# Patient Record
Sex: Male | Born: 1995 | Race: White | Hispanic: No | Marital: Single | Smoking: Never smoker
Health system: Southern US, Community
[De-identification: ages and names within clinical notes are randomized; demographics above are authoritative.]

---
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
## 2018-01-31 IMAGING — DX DG KNEE AP/LAT W/ SUNRISE*R*
3 series · 3 of 3 positions shown · non-contrast
Comparison: None.

CLINICAL DATA: Lateral right knee palpable mass for the past 3
months. Pain at that location.

EXAM:
RIGHT KNEE 3 VIEWS

[knee ap]
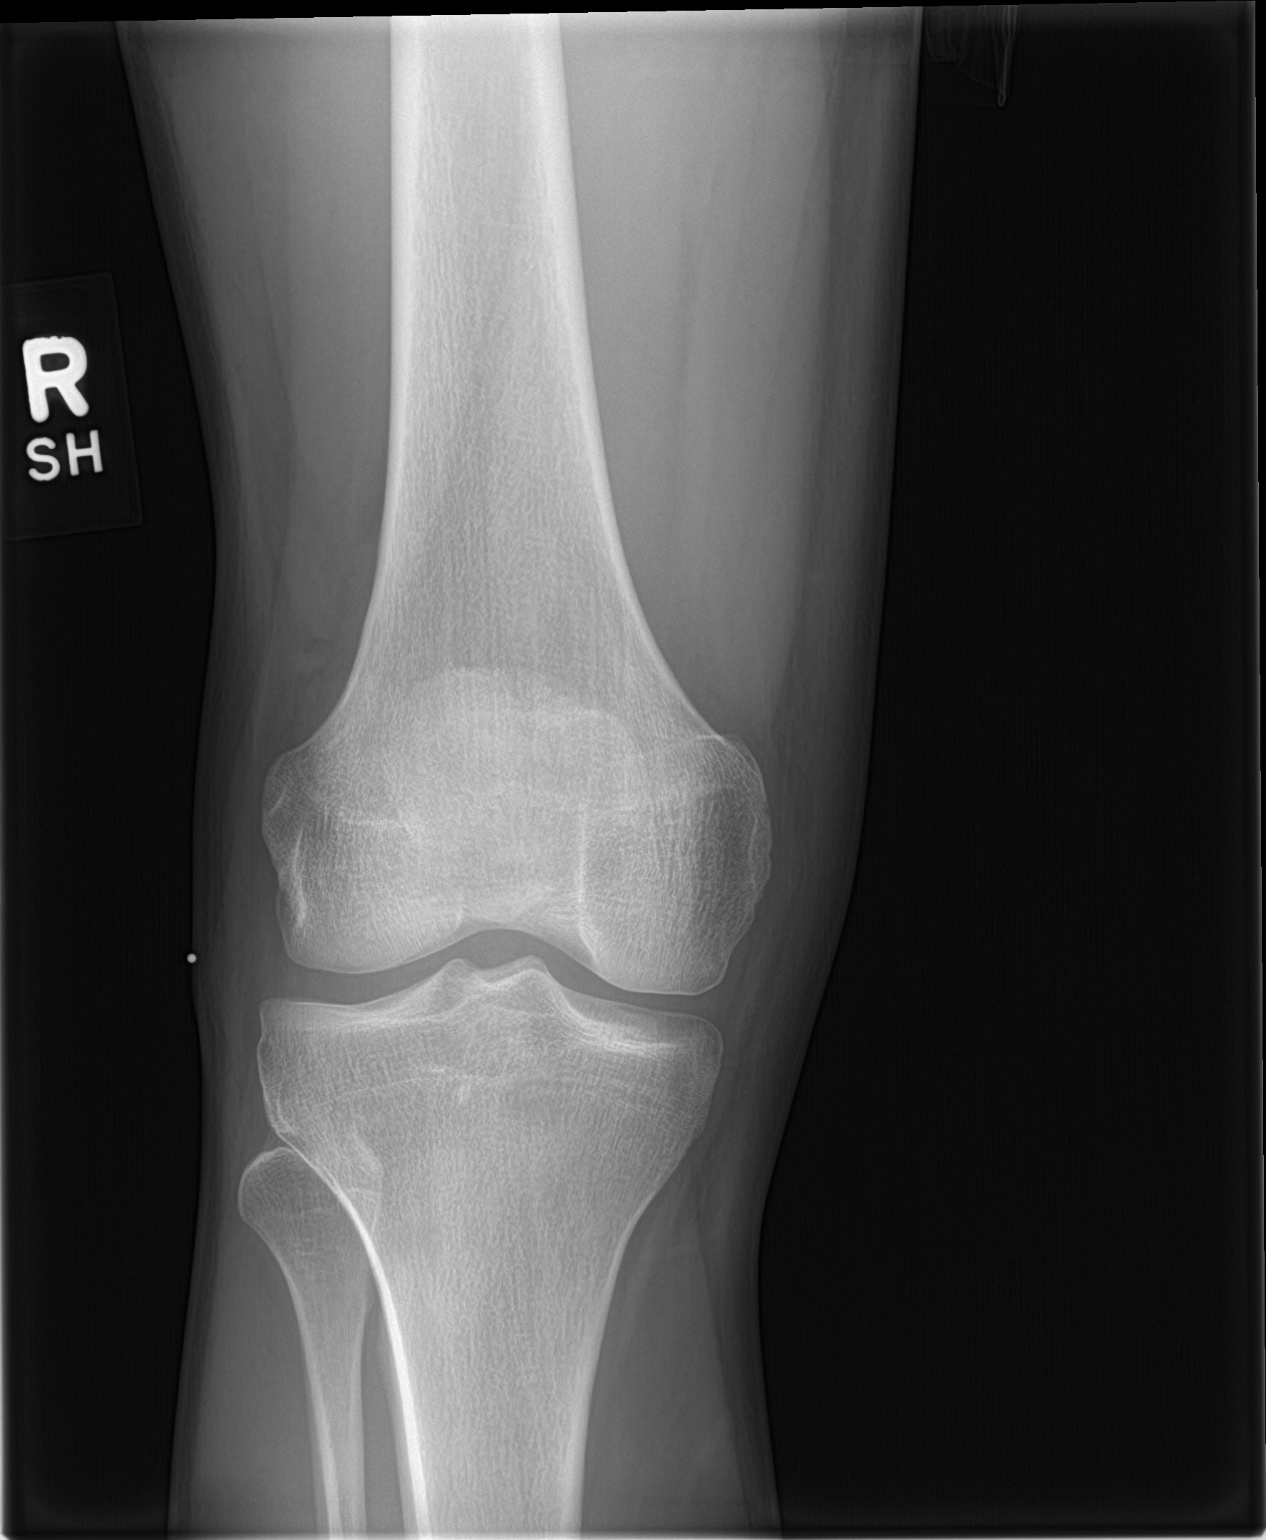

[knee lat]
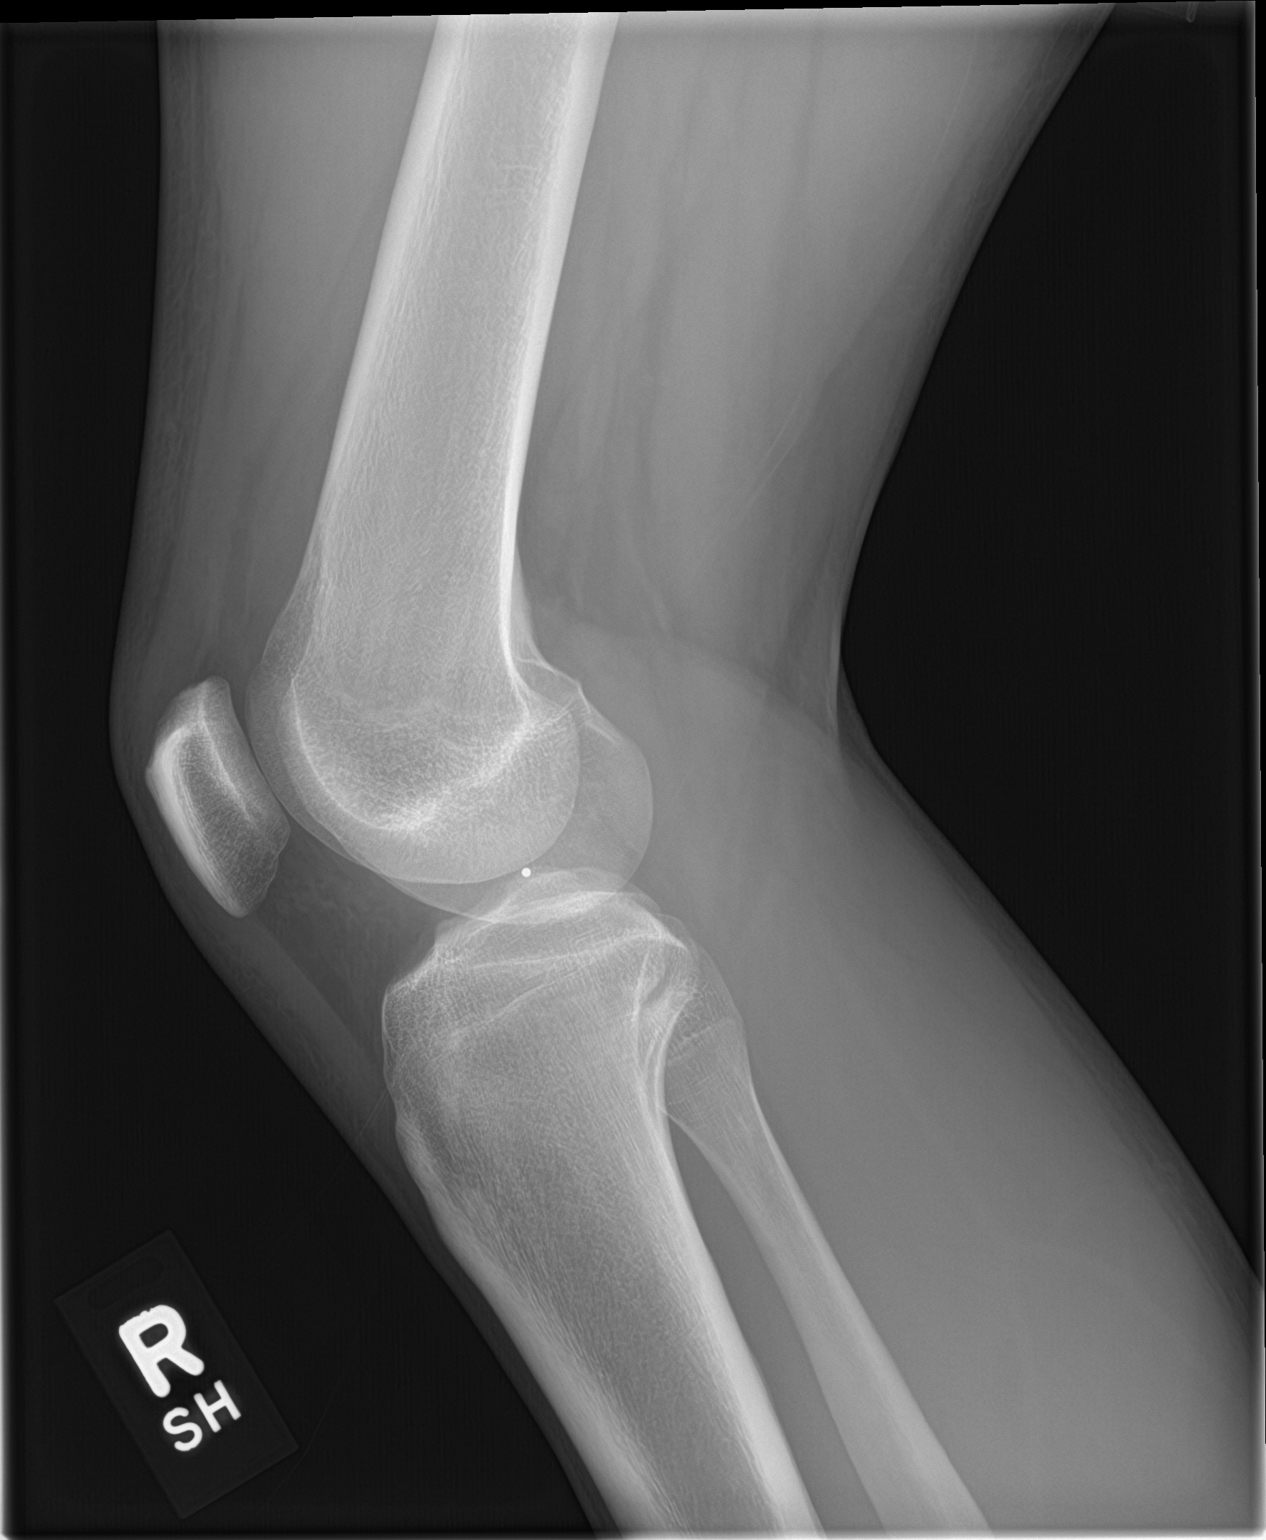

[knee sunrise]
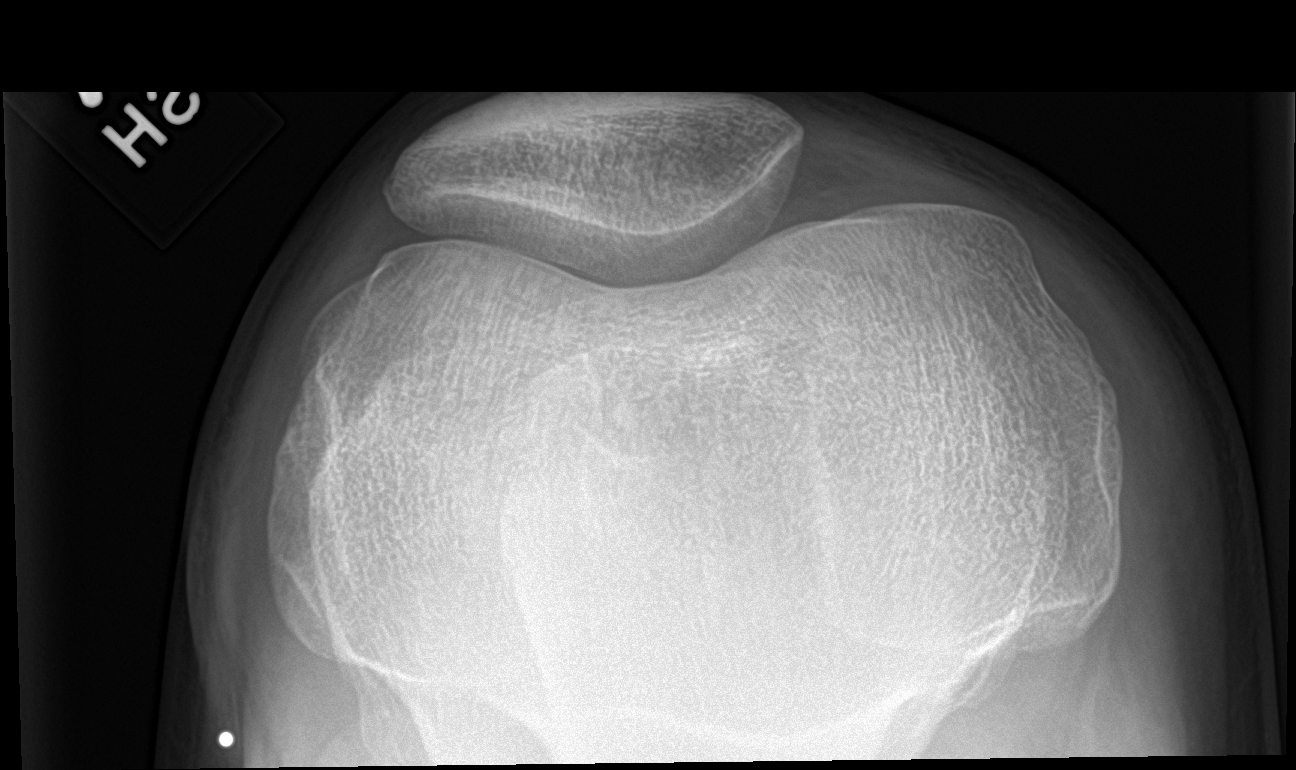

[3 of 3 positions shown; findings below may reference images not displayed]

FINDINGS: Three views of the right knee are submitted for interpretation. A
marker is demonstrated at the location of the palpable mass and
focal pain. There is mild outward bulging of the soft tissues at
that location. No calcification is seen. The bones appear normal. No
effusion.
IMPRESSION: Mild lower soft tissue bulging at the location of the palpable mass
and pain in the lateral right knee. This could be due to a soft
tissue mass or ganglion cyst. These possibilities could be
differentiated with a magnetic resonance imaging examination of the
knee.

## 2023-04-28 ENCOUNTER — Other Ambulatory Visit: Payer: Self-pay

## 2023-04-28 ENCOUNTER — Emergency Department (HOSPITAL_BASED_OUTPATIENT_CLINIC_OR_DEPARTMENT_OTHER)
Admission: EM | Admit: 2023-04-28 | Discharge: 2023-04-29 | Disposition: A | Discharge status: Home / Self Care | Payer: BC Managed Care – PPO | Attending: Emergency Medicine | Admitting: Emergency Medicine

## 2023-04-28 ENCOUNTER — Encounter (HOSPITAL_BASED_OUTPATIENT_CLINIC_OR_DEPARTMENT_OTHER): Payer: Self-pay | Admitting: Emergency Medicine

## 2023-04-28 DIAGNOSIS — Z23 Encounter for immunization: Secondary | ICD-10-CM | POA: Diagnosis not present

## 2023-04-28 DIAGNOSIS — S01312A Laceration without foreign body of left ear, initial encounter: Secondary | ICD-10-CM | POA: Insufficient documentation

## 2023-04-28 DIAGNOSIS — W540XXA Bitten by dog, initial encounter: Secondary | ICD-10-CM | POA: Insufficient documentation

## 2023-04-28 NOTE — ED Triage Notes (Signed)
Dog bite to left ear, personal dog, shots up to date.

## 2023-04-29 MED ORDER — AMOXICILLIN-POT CLAVULANATE 875-125 MG PO TABS
1.0000 | ORAL_TABLET | Freq: Once | ORAL | Status: AC
  Administered 2023-04-29: 1 via ORAL
  Filled 2023-04-29: qty 1

## 2023-04-29 MED ORDER — LIDOCAINE-EPINEPHRINE-TETRACAINE (LET) TOPICAL GEL
3.0000 mL | Freq: Once | TOPICAL | Status: AC
  Administered 2023-04-29: 3 mL via TOPICAL
  Filled 2023-04-29: qty 3

## 2023-04-29 MED ORDER — TETANUS-DIPHTH-ACELL PERTUSSIS 5-2.5-18.5 LF-MCG/0.5 IM SUSY
0.5000 mL | PREFILLED_SYRINGE | Freq: Once | INTRAMUSCULAR | Status: AC
  Administered 2023-04-29: 0.5 mL via INTRAMUSCULAR
  Filled 2023-04-29: qty 0.5

## 2023-04-29 MED ORDER — AMOXICILLIN-POT CLAVULANATE 875-125 MG PO TABS: 1.0000 | ORAL_TABLET | Freq: Two times a day (BID) | ORAL | 0 refills | Status: AC

## 2023-04-29 NOTE — Discharge Instructions (Addendum)
 It was a pleasure caring for you today in the emergency department.  You can wash the wound with gentle soap and water twice daily over the next 7 days.  Please return either here, urgent care, primary care doctor's office to have sutures evaluated for removal in approximately 5 to 7 days.  If he noticed any significant redness, significant pain, pus draining from the wound, severe swelling, any worsening / worrisome symptoms please return to the ER for evaluation.  Do not submerge your head underwater, no hot tubs or swimming pools until the wound has healed completely.  Avoid direct sun exposure to reduce scar formation, wear a hat when out in the sun to cover the ears. Use sunblock once the wound has entirely healed.  Please return to the emergency department for any worsening or worrisome symptoms.

## 2023-04-29 NOTE — ED Provider Notes (Signed)
 [facility] Provider Note  CSN: [id number] Arrival date & time: 04/28/23 2049  Chief Complaint(s) Animal Bite  HPI Brendan Rivera is a 28 y.o. male with past medical history as below, significant for no significant medical or surgical history who presents to the ED with complaint of dog bite  Patient reports his personal dog bit him on his ear just prior to arrival.  Per the patient dog is up-to-date on immunizations.  Only injuries to the left ear.  He is unsure of last tetanus shot.  No hearing changes.  No difficulty swallowing or painful jaw movement.  Was feeling well prior to injury  Past Medical History History reviewed. No pertinent past medical history. Patient Active Problem List   Diagnosis Date Noted   Right knee pain 06/24/2016   Home Medication(s) Prior to Admission medications   Medication Sig Start Date End Date Taking? Authorizing Provider  amoxicillin -clavulanate (AUGMENTIN ) 875-125 MG tablet Take 1 tablet by mouth every 12 (twelve) hours. 04/29/23  Yes Elnor Jayson LABOR, DO                                                                                                                                    Past Surgical History History reviewed. No pertinent surgical history. Family History History reviewed. No pertinent family history.  Social History Social History   Tobacco Use   Smoking status: Never   Smokeless tobacco: Never   Allergies Patient has no known allergies.  Review of Systems Review of Systems  Constitutional:  Negative for chills and fever.  HENT:  Positive for ear pain. Negative for sore throat and trouble swallowing.   Eyes:  Negative for pain.  Respiratory:  Negative for shortness of breath.   Cardiovascular:  Negative for chest pain.  All other systems reviewed and are negative.   Physical Exam Vital Signs  I have reviewed the triage vital signs BP 124/61 (BP Location: Left Arm)   Pulse (!)  102   Temp 98 F (36.7 C) (Oral)   Resp 18   Ht 6' (1.829 m)   Wt 72.6 kg   SpO2 100%   BMI 21.70 kg/m  Physical Exam Vitals and nursing note reviewed.  Constitutional:      General: He is not in acute distress.    Appearance: Normal appearance. He is well-developed. He is not ill-appearing.  HENT:     Head: Normocephalic.     Right Ear: External ear normal.     Ears:      Comments: Left-sided injury to earlobe.  TM is intact.  No pain to tragus.  No drooling stridor or trismus.  No pain with neck movement or jaw movement.    Nose: Nose normal.     Mouth/Throat:     Mouth: Mucous membranes are moist.  Eyes:     General: No scleral icterus.  Right eye: No discharge.        Left eye: No discharge.  Cardiovascular:     Rate and Rhythm: Normal rate.  Pulmonary:     Effort: Pulmonary effort is normal. No respiratory distress.     Breath sounds: No stridor.  Abdominal:     General: Abdomen is flat. There is no distension.     Tenderness: There is no guarding.  Musculoskeletal:        General: No deformity.     Cervical back: No rigidity.  Skin:    General: Skin is warm and dry.     Coloration: Skin is not cyanotic, jaundiced or pale.  Neurological:     Mental Status: He is alert and oriented to person, place, and time.     GCS: GCS eye subscore is 4. GCS verbal subscore is 5. GCS motor subscore is 6.  Psychiatric:        Speech: Speech normal.        Behavior: Behavior normal. Behavior is cooperative.     ED Results and Treatments Labs (all labs ordered are listed, but only abnormal results are displayed) Labs Reviewed - No data to display                                                                                                                        Radiology No results found.  Pertinent labs & imaging results that were available during my care of the patient were reviewed by me and considered in my medical decision making (see MDM for  details).  Medications Ordered in ED Medications  Tdap (BOOSTRIX ) injection 0.5 mL (0.5 mLs Intramuscular Given 04/29/23 0058)  amoxicillin -clavulanate (AUGMENTIN ) 875-125 MG per tablet 1 tablet (1 tablet Oral Given 04/29/23 0057)  lidocaine -EPINEPHrine -tetracaine  (LET) topical gel (3 mLs Topical Given 04/29/23 0207)                                                                                                                                     Procedures .Laceration Repair  Date/Time: 04/29/2023 3:30 AM  Performed by: Elnor Jayson LABOR, DO Authorized by: Elnor Jayson LABOR, DO   Consent:    Consent obtained:  Verbal   Consent given by:  Patient   Risks, benefits, and alternatives were discussed: yes     Risks discussed:  Infection, pain and need for additional repair   Alternatives discussed:  No treatment and delayed treatment Universal protocol:    Procedure explained and questions answered to patient or proxy's satisfaction: yes     Patient identity confirmed:  Verbally with patient and arm band Anesthesia:    Anesthesia method:  Topical application   Topical anesthetic:  LET Laceration details:    Location:  Ear   Ear location:  L ear   Length (cm):  0.5   Depth (mm):  1 Exploration:    Limited defect created (wound extended): no     Hemostasis achieved with:  Direct pressure   Wound exploration: wound explored through full range of motion and entire depth of wound visualized     Contaminated: no   Treatment:    Area cleansed with:  Saline   Amount of cleaning:  Standard   Debridement:  None Skin repair:    Repair method:  Sutures   Suture size:  5-0   Suture material:  Nylon   Number of sutures:  1 Approximation:    Approximation:  Loose Repair type:    Repair type:  Simple Post-procedure details:    Dressing:  Antibiotic ointment   Procedure completion:  Tolerated well, no immediate complications   (including critical care time)  Medical Decision Making / ED  Course    Medical Decision Making:    Brendan Rivera is a 28 y.o. male with past medical history as below, significant for no significant medical or surgical history who presents to the ED with complaint of dog bite. The complaint involves an extensive differential diagnosis and also carries with it a high risk of complications and morbidity.  Serious etiology was considered. Ddx includes but is not limited to: Laceration, abrasion, soft tissue injury, avulsion, fb etc.  Complete initial physical exam performed, notably the patient was in no distress, bleeding appears to have stopped.    Reviewed and confirmed nursing documentation for past medical history, family history, social history.  Vital signs reviewed.         Brief summary: 28 year old male without significant interval history here after a dog bite to his left ear.  Patient reports this was his personal dog.  Dog is up-to-date on immunizations.  Patient is unsure of his last tetanus shot.  Will update here in the ER.  Will clean the wound.  Will start Augmentin .  Wound was cleaned, does have small laceration to medial aspect of his ear/antitragus.  He has an avulsion to the outer rim of the pinna and superficial abrasion to the middle part of the earlobe.  See procedure note.  Discussed wound care instructions at home.  Start antibiotics, given tetanus shot.  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 Wound care d/w pt for home.   The patient improved significantly and was discharged in stable condition. Detailed discussions were had with the patient/guardian regarding current findings, and need for close f/u with PCP or on call doctor. The patient/guardian has been instructed to return immediately if the symptoms worsen in any way for re-evaluation. Patient/guardian verbalized understanding and is in agreement with current care plan. All questions answered prior  to discharge.                  Additional history obtained: -Additional history obtained from family -External records from outside source obtained and reviewed including: Chart review including previous notes, labs, imaging, consultation notes including  Primary care documentation, home medications, allergy list  Lab Tests: na  EKG   EKG Interpretation Date/Time:    Ventricular Rate:    PR Interval:    QRS Duration:    QT Interval:    QTC Calculation:   R Axis:      Text Interpretation:           Imaging Studies ordered: na   Medicines ordered and prescription drug management: Meds ordered this encounter  Medications   Tdap (BOOSTRIX ) injection 0.5 mL   amoxicillin -clavulanate (AUGMENTIN ) 875-125 MG per tablet 1 tablet   lidocaine -EPINEPHrine -tetracaine  (LET) topical gel   amoxicillin -clavulanate (AUGMENTIN ) 875-125 MG tablet    Sig: Take 1 tablet by mouth every 12 (twelve) hours.    Dispense:  14 tablet    Refill:  0    -I have reviewed the patients home medicines and have made adjustments as needed   Consultations Obtained: na   Cardiac Monitoring: Continuous pulse oximetry interpreted by myself, 100% on RA.    Social Determinants of Health:  Diagnosis or treatment significantly limited by social determinants of health: na   Reevaluation: After the interventions noted above, I reevaluated the patient and found that they have improved  Co morbidities that complicate the patient evaluation History reviewed. No pertinent past medical history.    Dispostion: Disposition decision including need for hospitalization was considered, and patient discharged from emergency department.    Final Clinical Impression(s) / ED Diagnoses Final diagnoses:  Dog bite of left ear, initial encounter  Laceration of left earlobe, initial encounter        Elnor Jayson LABOR, DO 04/29/23 [phone]
